# Patient Record
Sex: Male | Born: 1965 | Race: White | Hispanic: No | Marital: Single | Smoking: Former smoker
Health system: Southern US, Community
[De-identification: ages and names within clinical notes are randomized; demographics above are authoritative.]

## PROBLEM LIST (undated history)

## (undated) DIAGNOSIS — I10 Essential (primary) hypertension: Secondary | ICD-10-CM

## (undated) HISTORY — PX: ANKLE SURGERY: SHX546

---

## 2007-10-26 ENCOUNTER — Inpatient Hospital Stay (HOSPITAL_COMMUNITY): Admission: EM | Admit: 2007-10-26 | Discharge: 2007-10-27 | Payer: Self-pay | Admitting: Emergency Medicine

## 2008-12-04 IMAGING — CR DG CHEST 2V
2 series · 2 of 2 positions shown · non-contrast
Comparison: No priors

CLINICAL DATA: Chest pain

CHEST - 2 VIEW

[view not recorded (1 of 2)]
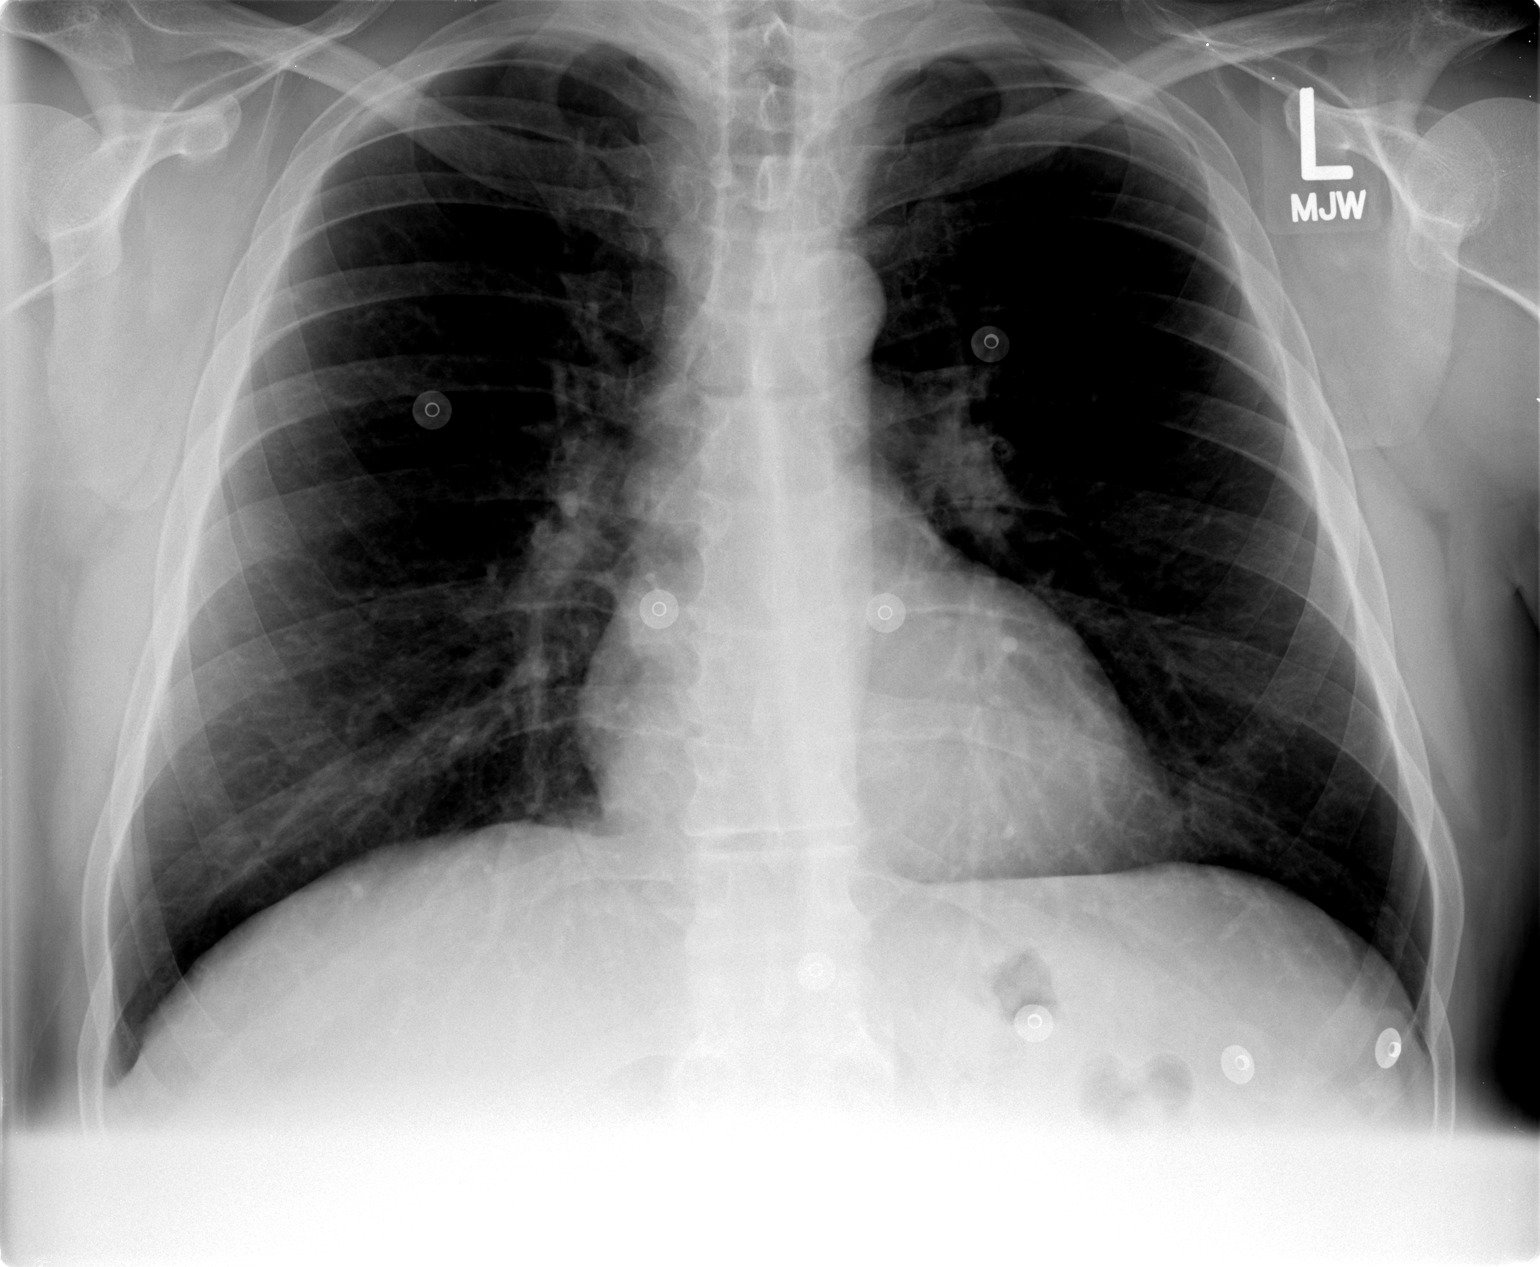

[view not recorded (2 of 2)]
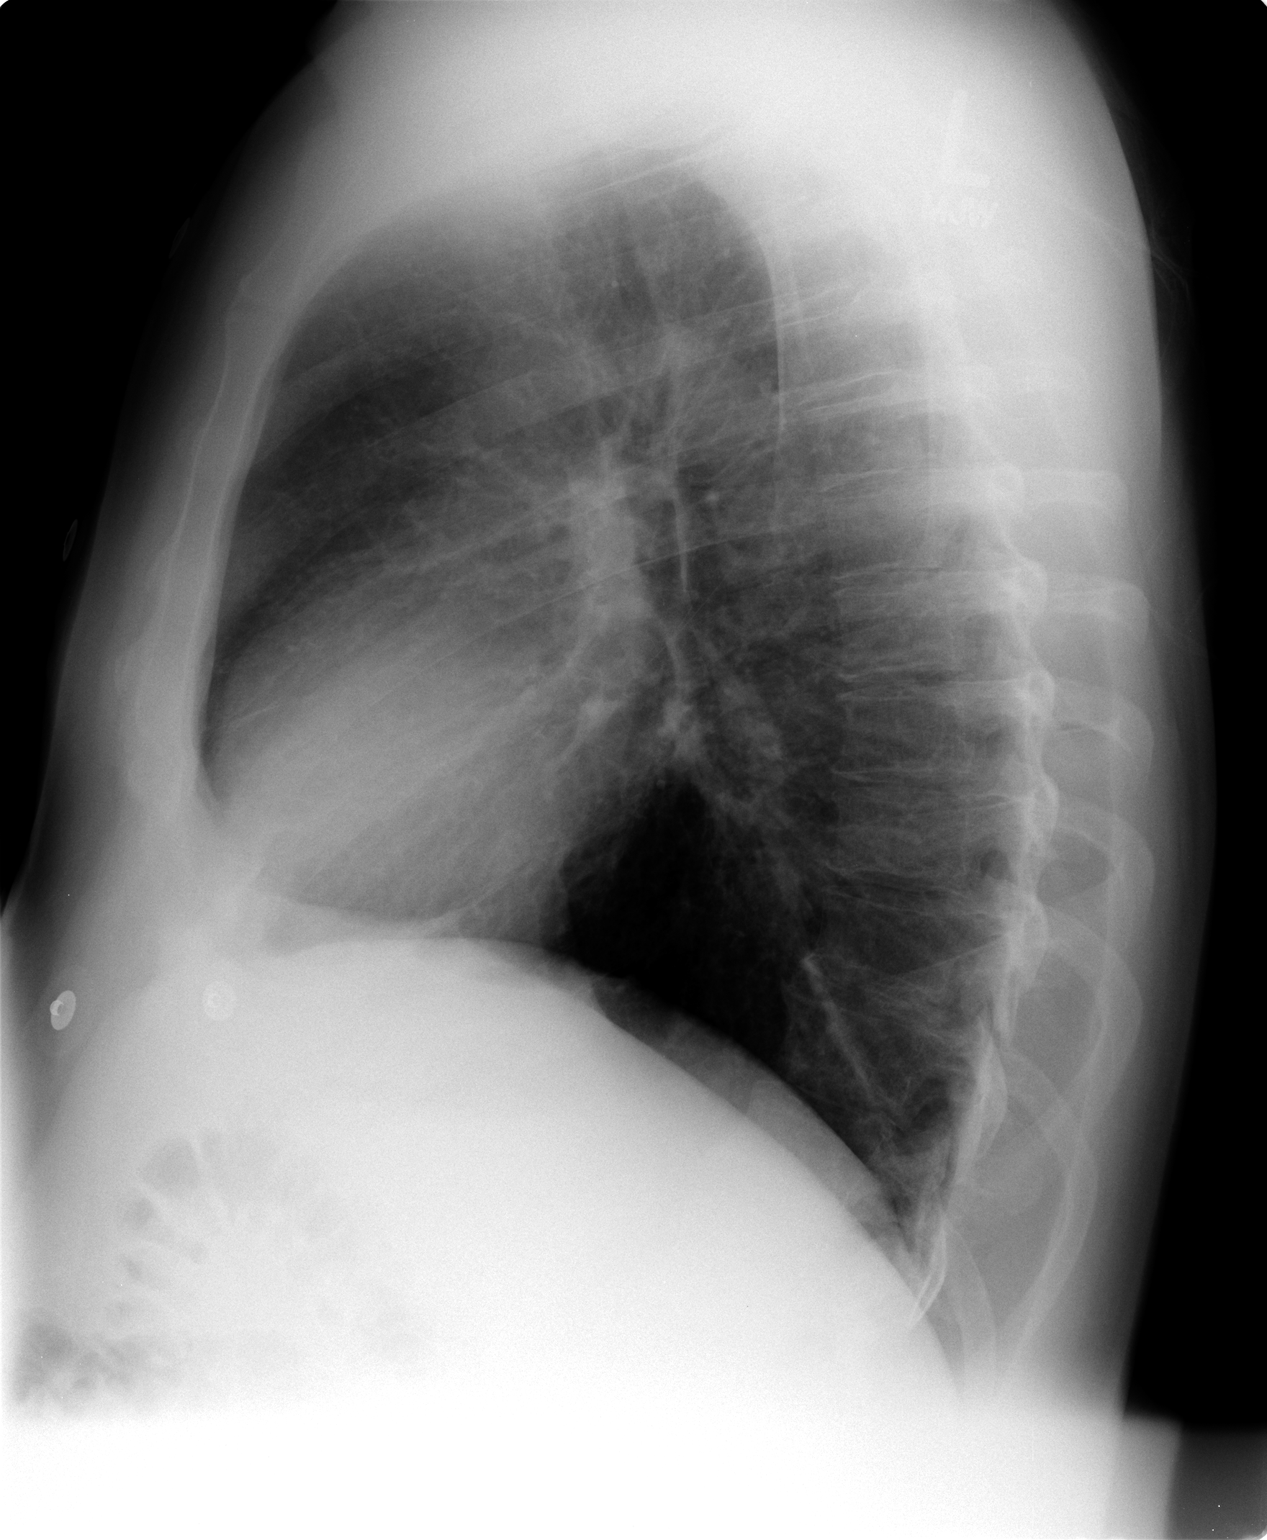

[2 of 2 positions shown; findings below may reference images not displayed]

FINDINGS: Heart and mediastinal contours normal.  Lungs clear.
Osseous structures intact.  No abnormality of the soft tissues.
IMPRESSION: No active disease.

## 2010-10-15 NOTE — Discharge Summary (Signed)
Aaron Lewis, Aaron Lewis             ACCOUNT NO.:  [id number]   MEDICAL RECORD NO.:  [id number]          PATIENT TYPE:  INP   LOCATION:  3731                         FACILITY:  [facility]   PHYSICIAN:  Beckey Rutter, MD  DATE OF BIRTH:  Jun 23, 1965   DATE OF ADMISSION:  10/26/2007  DATE OF DISCHARGE:  10/27/2007                               DISCHARGE SUMMARY   PRIMARY CARE PHYSICIAN:  Unassigned to [facility].   CHIEF COMPLAINT:  Chest pain.  Please refer to the H and P dictated  yesterday, day of admission for detailed account on the presenting  complaint.   HOSPITAL COURSE:  During hospitalization, the patient was ruled out for  acute coronary syndromes by serial EKGs and by cardiac enzymes.  The  patient is pain free today.   The patient was seen in consultation by [facility] with the  recommendation to follow up as an outpatient for possible stress test.  The patient is stable today to be released to follow up with his primary  physician and to follow up with [facility] for possible stress test.  The patient is aware and agreeable to discharge plan.   The patient does not have primary physician.  Currently, he was advised  to see a primary physician within 1 week.  The patient understand the  importance of follow up and he is agreeable to this plan.   Smoking.  The patient was counseled.   Drug abuse.  The patient counseled.   Hyperlipidemia.  The patient's HDL is low, although the LDL is  acceptable.  The patient was advised to make lifestyle modification and  to follow up with the primary.   Hospital consultation.  [facility].   DISCHARGE DIAGNOSES:  1. Chest pain, likely musculoskeletal.  2. Tobacco abuse.  3. Drug abuse.   DISCHARGE MEDICATION:  Aspirin 81 mg p.o. daily.   DISCHARGE DATA:  BNP is 77.0.  Cardiac panel was showing troponin 0.03  and CK-MB is 1.8, this is the third one.  Total cholesterol is 131, LDL  is 78, HDL is 22,  D-dimer is 0.22, and TSH is 1.9.  Drug screen in the  urine is  positive for benzodiazepines and tetrahydrocannabinol.  White blood  count on admission is 7.8, platelet count is 219, hemoglobin is 15.7,  and hematocrit is 45.5.   The patient is stable for discharge.      Beckey Rutter, MD  Electronically Signed     EME/MEDQ  D:  10/27/2007  T:  10/27/2007  Job:  [id number]

## 2010-10-15 NOTE — H&P (Signed)
NAMECHADWICK, Aaron Lewis             ACCOUNT NO.:  [id number]   MEDICAL RECORD NO.:  [id number]          PATIENT TYPE:  INP   LOCATION:  1826                         FACILITY:  [facility]   PHYSICIAN:  Eduard Clos, MDDATE OF BIRTH:  1965/09/27   DATE OF ADMISSION:  10/26/2007  DATE OF DISCHARGE:                              HISTORY & PHYSICAL   CHIEF COMPLAINT:  Neck pain.   HISTORY OF PRESENT ILLNESS:  A 45 year old male with no significant past  medical history.  Presently he comes into the ER with neck pain which  was radiating to his left chest and all the way to his left leg.  The  patient stated that this started last evening.  The patient, in  addition, was found to have another episode in the morning when he was  working at the [employer] when he developed this episode.  Along with  pain,  which was radiating all the way from his neck down to his legs, the  patient also became diaphoretic and nauseated.  The patient denies any  headache, dizziness, abdominal pain, dysuria, discharge, diarrhea.  The  patient did have the symptoms on exertion.   PAST MEDICAL HISTORY:  Nothing significant.   MEDICATIONS PRIOR TO ADMISSION:  None.   SOCIAL HISTORY:  The patient lives [place] girlfriend in [place],  comes to [place] for work.  Smokes cigarettes and abuses marijuana  and benzodiazepines.  Otherwise, the patient quit smoking and does not  abuse drugs.   FAMILY HISTORY:  Nothing contributory.   REVIEW OF SYSTEMS:  As in History of Present Illness, nothing else seen  of significance.   PHYSICAL EXAMINATION:  GENERAL:  The patient was examined at bedside,  not in acute distress.  VITAL SIGNS:  Blood pressure 143/94, pulse 79 per minute, temperature  98.1, respirations 18 per minute.  O2 saturation 99%.  HEENT:  Anicteric.  No pallor.  CHEST:  Bilaterally clear to auscultation. No rhonchi, no crepitation.  HEART:  S1, S2 heard.  ABDOMEN:  Soft, nontender.  Bowel sounds heard.   No guarding, no  rigidity.  CENTRAL NERVOUS SYSTEM:  Alert and oriented to time, place, and person.  No focal deficits.  EXTREMITIES:  Peripheral pulses felt.  No edema.   LABORATORY DATA:  EKG:  Normal sinus rhythm with no acute ST-T changes.   Chest x-ray:  Nothing acute.   CBC: WBC 7.8, hemoglobin 16, hematocrit 47, platelets 209, neutrophils  61%.  CMP:  Sodium 140, potassium 3.9, chloride 105, glucose 91, BUN 9,  creatinine 1.  CK-MB 1 and less than 1.  Troponin less than 0.05 and  0.16.  Toxicology screen is negative for cocaine.  It is positive for  benzodiazepines and marijuana.  UA is negative.   ASSESSMENT:  1. Neck pain with chest pain with mildly elevated troponin.  2. Hypertension.  3. Drug abuse.  4. Cigarette smoking.   PLAN:  1. Admit patient to telemetry, Team D.  2. Follow cardiac markers.  3. Cardiology has been already consulted and will follow up with their      recommendations.  4. We advised patient not to smoke cigarettes or abuse drugs.  5. Further recommendations as patient's condition evolves.      Eduard Clos, MD  Electronically Signed     ANK/MEDQ  D:  10/26/2007  T:  10/26/2007  Job:  [id number]

## 2011-02-26 LAB — POCT CARDIAC MARKERS
Myoglobin, poc: 68.9
Operator id: 222501
Operator id: 234501
Troponin i, poc: 0.16 — ABNORMAL HIGH
Troponin i, poc: <0.05

## 2011-02-26 LAB — CBC
MCHC: 34.5
Platelets: 219
RDW: 13.2

## 2011-02-26 LAB — DIFFERENTIAL
Basophils Absolute: 0.1
Basophils Relative: 1
Monocytes Absolute: 0.7
Neutro Abs: 4.7
Neutrophils Relative %: 61

## 2011-02-26 LAB — LIPID PANEL: VLDL: 31

## 2011-02-26 LAB — URINALYSIS, ROUTINE W REFLEX MICROSCOPIC
Bilirubin Urine: NEGATIVE
Nitrite: NEGATIVE
Specific Gravity, Urine: 1.006
Urobilinogen, UA: 0.2

## 2011-02-26 LAB — CARDIAC PANEL(CRET KIN+CKTOT+MB+TROPI)
CK, MB: 1.8
Total CK: 136

## 2011-02-26 LAB — CK TOTAL AND CKMB (NOT AT ARMC)
Relative Index: 1.5
Total CK: 188

## 2011-02-26 LAB — RAPID URINE DRUG SCREEN, HOSP PERFORMED
Opiates: NOT DETECTED
Tetrahydrocannabinol: POSITIVE — AB

## 2011-02-26 LAB — POCT I-STAT, CHEM 8
BUN: 9
Calcium, Ion: 1.17
Chloride: 105
Sodium: 140

## 2011-02-26 LAB — B-NATRIURETIC PEPTIDE (CONVERTED LAB): Pro B Natriuretic peptide (BNP): 77

## 2011-02-26 LAB — TROPONIN I: Troponin I: 0.02

## 2017-02-12 NOTE — Congregational Nurse Program (Signed)
Congregational Nurse Program Note  Date of Encounter: 02/09/2017  Past Medical History: No past medical history on file.  Encounter Details:     CNP Questionnaire - 02/02/17 1100      Patient Demographics   Is this a new or existing patient? New   Patient is considered a/an Not Applicable   Race Caucasian/White     Patient Assistance   Location of Patient Assistance [place]   Patient's financial/insurance status Self-Pay (Uninsured)   Uninsured Patient ([place] [employer]Care Connects) No   Interventions Assisted patient in making appt.   Patient referred to apply for the following financial assistance [employer]   Food insecurities addressed Not Applicable   Transportation assistance No   Assistance securing medications No   Educational health offerings Hypertension;Navigating the healthcare system     Encounter Details   Primary purpose of visit Navigating the Healthcare System   Was an Emergency Department visit averted? No   Does patient have a medical provider? No   Patient referred to [facility];Establish PCP   Was a mental health screening completed? (GAINS tool) No   Does patient have dental issues? No   Does patient have vision issues? No   Does your patient have an abnormal blood pressure today? Yes   Since previous encounter, have you referred patient for abnormal blood pressure that resulted in a new diagnosis or medication change? No   Does your patient have an abnormal blood glucose today? No   Since previous encounter, have you referred patient for abnormal blood glucose that resulted in a new diagnosis or medication change? No   Was there a life-saving intervention made? No     [facility] to [facility] a primarycare provider.  Client has medical conditions: steel plate in foot, complaints of pain in riight wrist. Client has elevated BP today. 167/119 P-87. Client is not on medications.  Client has applied for disability but was  denied.  Client was given information about Teacher, adult [facility]CareConnect [facility] [employer] [facility].to apply for MAP. Telephone call to schedule appointment. Client will be notified of appt.  Pearletha AlfredJan Nkosi Cortright,RN   [phone].

## 2017-02-23 ENCOUNTER — Other Ambulatory Visit (HOSPITAL_COMMUNITY)
Admission: RE | Admit: 2017-02-23 | Discharge: 2017-02-23 | Disposition: A | Discharge status: Home / Self Care | Payer: Self-pay | Source: Ambulatory Visit | Attending: Physician Assistant | Admitting: Physician Assistant

## 2017-02-23 ENCOUNTER — Ambulatory Visit: Payer: Self-pay | Admitting: Physician Assistant

## 2017-02-23 ENCOUNTER — Encounter: Payer: Self-pay | Admitting: Physician Assistant

## 2017-02-23 VITALS — BP 144/100 | HR 82 | Temp 97.7°F | Ht 69.0 in | Wt 268.5 lb

## 2017-02-23 DIAGNOSIS — Z125 Encounter for screening for malignant neoplasm of prostate: Secondary | ICD-10-CM | POA: Insufficient documentation

## 2017-02-23 DIAGNOSIS — R030 Elevated blood-pressure reading, without diagnosis of hypertension: Secondary | ICD-10-CM

## 2017-02-23 DIAGNOSIS — Z1211 Encounter for screening for malignant neoplasm of colon: Secondary | ICD-10-CM

## 2017-02-23 DIAGNOSIS — E669 Obesity, unspecified: Secondary | ICD-10-CM | POA: Insufficient documentation

## 2017-02-23 DIAGNOSIS — Z131 Encounter for screening for diabetes mellitus: Secondary | ICD-10-CM

## 2017-02-23 DIAGNOSIS — Z13220 Encounter for screening for lipoid disorders: Secondary | ICD-10-CM

## 2017-02-23 LAB — PSA: Prostatic Specific Antigen: 0.34 ng/mL (ref 0.00–4.00)

## 2017-02-23 LAB — COMPREHENSIVE METABOLIC PANEL WITH GFR
ALT: 38 U/L (ref 17–63)
AST: 32 U/L (ref 15–41)
Albumin: 4.1 g/dL (ref 3.5–5.0)
Alkaline Phosphatase: 83 U/L (ref 38–126)
Anion gap: 8 (ref 5–15)
BUN: 7 mg/dL (ref 6–20)
CO2: 24 mmol/L (ref 22–32)
Calcium: 8.9 mg/dL (ref 8.9–10.3)
Chloride: 104 mmol/L (ref 101–111)
Creatinine, Ser: 0.74 mg/dL (ref 0.61–1.24)
GFR calc Af Amer: >60 mL/min
GFR calc non Af Amer: >60 mL/min
Glucose, Bld: 157 mg/dL — ABNORMAL HIGH (ref 65–99)
Potassium: 3.8 mmol/L (ref 3.5–5.1)
Sodium: 136 mmol/L (ref 135–145)
Total Bilirubin: 0.6 mg/dL (ref 0.3–1.2)
Total Protein: 7.6 g/dL (ref 6.5–8.1)

## 2017-02-23 LAB — HEMOGLOBIN A1C
Hgb A1c MFr Bld: 5.9 % — ABNORMAL HIGH (ref 4.8–5.6)
Mean Plasma Glucose: 122.63 mg/dL

## 2017-02-23 LAB — LIPID PANEL
Cholesterol: 146 mg/dL (ref 0–200)
HDL: 28 mg/dL — ABNORMAL LOW
LDL Cholesterol: 98 mg/dL (ref 0–99)
Total CHOL/HDL Ratio: 5.2 ratio
Triglycerides: 98 mg/dL
VLDL: 20 mg/dL (ref 0–40)

## 2017-02-23 NOTE — Progress Notes (Signed)
BP (!) 144/100 (BP Location: Left Arm, Patient Position: Sitting, Cuff Size: Large)   Pulse 82   Temp 97.7 F (36.5 C) (Other (Comment))   Ht  (1.753 m)   Wt 268 lb 8 oz (121.8 kg)   SpO2 98%   BMI 39.65 kg/m    Subjective:    Patient ID: Aaron Lewis, male    DOB: 1966-01-28, 51 y.o.   MRN: [id number]  HPI: Darrol Brandenburg is a 51 y.o. male presenting on 02/23/2017 for New Patient (Initial Visit)   HPI  Pt says it's been many years since he had a PCP  C/o pain L wrist- x 2 year. Injured it 10year ago- jammed on handlebars but only started hurting 2 year ago.    Also c/o pain L ankle due to 10 bolts in it.  Hurts all the time  Pt doesn't work.  Relevant past medical, surgical, family and social history reviewed and updated as indicated. Interim medical history since our last visit reviewed. Allergies and medications reviewed and updated.  No current outpatient prescriptions on file.   Review of Systems  Constitutional: Positive for diaphoresis and fatigue. Negative for appetite change, chills, fever and unexpected weight change.  HENT: Positive for dental problem and ear pain. Negative for congestion, drooling, facial swelling, hearing loss, mouth sores, sneezing, sore throat, trouble swallowing and voice change.   Eyes: Negative for pain, discharge, redness, itching and visual disturbance.  Respiratory: Negative for cough, choking, shortness of breath and wheezing.   Cardiovascular: Positive for leg swelling. Negative for chest pain and palpitations.  Gastrointestinal: Positive for constipation. Negative for abdominal pain, blood in stool, diarrhea and vomiting.  Endocrine: Negative for cold intolerance, heat intolerance and polydipsia.  Genitourinary: Negative for decreased urine volume, dysuria and hematuria.  Musculoskeletal: Positive for arthralgias, back pain and gait problem.  Skin: Negative for rash.  Allergic/Immunologic: Negative for environmental  allergies.  Neurological: Negative for seizures, syncope, light-headedness and headaches.  Hematological: Negative for adenopathy.  Psychiatric/Behavioral: Positive for dysphoric mood. Negative for agitation and suicidal ideas. The patient is nervous/anxious.     Per HPI unless specifically indicated above     Objective:    BP (!) 144/100 (BP Location: Left Arm, Patient Position: Sitting, Cuff Size: Large)   Pulse 82   Temp 97.7 F (36.5 C) (Other (Comment))   Ht  (1.753 m)   Wt 268 lb 8 oz (121.8 kg)   SpO2 98%   BMI 39.65 kg/m   Wt Readings from Last 3 Encounters:  02/23/17 268 lb 8 oz (121.8 kg)    Physical Exam  Constitutional: He is oriented to person, place, and time. He appears well-developed and well-nourished.  HENT:  Head: Normocephalic and atraumatic.  Mouth/Throat: Oropharynx is clear and moist. No oropharyngeal exudate.  Eyes: Pupils are equal, round, and reactive to light. Conjunctivae and EOM are normal.  Neck: Neck supple. No thyromegaly present.  Cardiovascular: Normal rate and regular rhythm.   Pulmonary/Chest: Effort normal and breath sounds normal. He has no wheezes. He has no rales.  Abdominal: Soft. Bowel sounds are normal. He exhibits no mass. There is no hepatosplenomegaly. There is no tenderness.  Musculoskeletal: He exhibits no edema.       Left wrist: Normal.  Lymphadenopathy:    He has no cervical adenopathy.  Neurological: He is alert and oriented to person, place, and time.  Skin: Skin is warm and dry. No rash noted.  Psychiatric: He has a normal mood  and affect. His behavior is normal. Thought content normal.  Vitals reviewed.        Assessment & Plan:   Encounter Diagnoses  Name Primary?  . Elevated blood-pressure reading without diagnosis of hypertension Yes  . Obesity, unspecified classification, unspecified obesity type, unspecified whether serious comorbidity present   . Screening cholesterol level   . Screening for  diabetes mellitus   . Screening for prostate cancer   . Screen for colon cancer     -will get baseline labs -pt is given ifobt for colon cancer screening -will Recheck bp 1 month before starting medication -pt to RTO sooner prn

## 2017-02-26 ENCOUNTER — Other Ambulatory Visit: Payer: Self-pay | Admitting: Physician Assistant

## 2017-02-26 DIAGNOSIS — Z1211 Encounter for screening for malignant neoplasm of colon: Secondary | ICD-10-CM

## 2017-02-26 LAB — IFOBT (OCCULT BLOOD): IFOBT: NEGATIVE

## 2017-03-02 ENCOUNTER — Ambulatory Visit: Payer: Self-pay | Admitting: Physician Assistant

## 2017-03-17 ENCOUNTER — Encounter: Payer: Self-pay | Admitting: Student

## 2017-03-25 ENCOUNTER — Ambulatory Visit: Payer: Self-pay | Admitting: Physician Assistant

## 2017-03-25 ENCOUNTER — Encounter: Payer: Self-pay | Admitting: Physician Assistant

## 2017-03-25 VITALS — BP 158/100 | HR 77 | Temp 97.7°F | Ht 69.0 in | Wt 270.2 lb

## 2017-03-25 DIAGNOSIS — I10 Essential (primary) hypertension: Secondary | ICD-10-CM

## 2017-03-25 DIAGNOSIS — R7303 Prediabetes: Secondary | ICD-10-CM

## 2017-03-25 DIAGNOSIS — M25532 Pain in left wrist: Secondary | ICD-10-CM

## 2017-03-25 MED ORDER — LISINOPRIL 10 MG PO TABS: 10.0000 mg | ORAL_TABLET | Freq: Every day | ORAL | 1 refills | Status: DC

## 2017-03-25 NOTE — Patient Instructions (Signed)
Prediabetes Prediabetes is the condition of having a blood sugar (blood glucose) level that is higher than it should be, but not high enough for you to be diagnosed with type 2 diabetes. Having prediabetes puts you at risk for developing type 2 diabetes (type 2 diabetes mellitus). Prediabetes may be called impaired glucose tolerance or impaired fasting glucose. Prediabetes usually does not cause symptoms. Your health care provider can diagnose this condition with blood tests. You may be tested for prediabetes if you are overweight and if you have at least one other risk factor for prediabetes. Risk factors for prediabetes include:  Having a family member with type 2 diabetes.  Being overweight or obese.  Being older than age 45.  Being of American-Indian, African-American, Hispanic/Latino, or Asian/Pacific Islander descent.  Having an inactive (sedentary) lifestyle.  Having a history of gestational diabetes or polycystic ovarian syndrome (PCOS).  Having low levels of good cholesterol (HDL-C) or high levels of blood fats (triglycerides).  Having high blood pressure.  What is blood glucose and how is blood glucose measured?  Blood glucose refers to the amount of glucose in your bloodstream. Glucose comes from eating foods that contain sugars and starches (carbohydrates) that the body breaks down into glucose. Your blood glucose level may be measured in mg/dL (milligrams per deciliter) or mmol/L (millimoles per liter).Your blood glucose may be checked with one or more of the following blood tests:  A fasting blood glucose (FBG) test. You will not be allowed to eat (you will fast) for at least 8 hours before a blood sample is taken. ? A normal range for FBG is 70-100 mg/dl (3.9-5.6 mmol/L).  An A1c (hemoglobin A1c) blood test. This test provides information about blood glucose control over the previous 2?3months.  An oral glucose tolerance test (OGTT). This test measures your blood  glucose twice: ? After fasting. This is your baseline level. ? Two hours after you drink a beverage that contains glucose.  You may be diagnosed with prediabetes:  If your FBG is 100?125 mg/dL (5.6-6.9 mmol/L).  If your A1c level is 5.7?6.4%.  If your OGGT result is 140?199 mg/dL (7.8-11 mmol/L).  These blood tests may be repeated to confirm your diagnosis. What happens if blood glucose is too high? The pancreas produces a hormone (insulin) that helps move glucose from the bloodstream into cells. When cells in the body do not respond properly to insulin that the body makes (insulin resistance), excess glucose builds up in the blood instead of going into cells. As a result, high blood glucose (hyperglycemia) can develop, which can cause many complications. This is a symptom of prediabetes. What can happen if blood glucose stays higher than normal for a long time? Having high blood glucose for a long time is dangerous. Too much glucose in your blood can damage your nerves and blood vessels. Long-term damage can lead to complications from diabetes, which may include:  Heart disease.  Stroke.  Blindness.  Kidney disease.  Depression.  Poor circulation in the feet and legs, which could lead to surgical removal (amputation) in severe cases.  How can prediabetes be prevented from turning into type 2 diabetes?  To help prevent type 2 diabetes, take the following actions:  Be physically active. ? Do moderate-intensity physical activity for at least 30 minutes on at least 5 days of the week, or as much as told by your health care provider. This could be brisk walking, biking, or water aerobics. ? Ask your health care provider what   activities are safe for you. A mix of physical activities may be best, such as walking, swimming, cycling, and strength training.  Lose weight as told by your health care provider. ? Losing 5-7% of your body weight can reverse insulin resistance. ? Your health  care provider can determine how much weight loss is best for you and can help you lose weight safely.  Follow a healthy meal plan. This includes eating lean proteins, complex carbohydrates, fresh fruits and vegetables, low-fat dairy products, and healthy fats. ? Follow instructions from your health care provider about eating or drinking restrictions. ? Make an appointment to see a diet and nutrition specialist (registered dietitian) to help you create a healthy eating plan that is right for you.  Do not smoke or use any tobacco products, such as cigarettes, chewing tobacco, and e-cigarettes. If you need help quitting, ask your health care provider.  Take over-the-counter and prescription medicines as told by your health care provider. You may be prescribed medicines that help lower the risk of type 2 diabetes.  This information is not intended to replace advice given to you by your health care provider. Make sure you discuss any questions you have with your health care provider. Document Released: 09/10/2015 Document Revised: 10/25/2015 Document Reviewed: 07/10/2015 Elsevier Interactive Patient Education  2018 [employer]vier Inc.  

## 2017-03-25 NOTE — Progress Notes (Signed)
BP (!) 158/100 (BP Location: Left Arm, Patient Position: Sitting, Cuff Size: Normal)   Pulse 77   Temp 97.7 F (36.5 C)   Ht 5\' 9"  (1.753 m)   Wt 270 lb 4 oz (122.6 kg)   SpO2 98%   BMI 39.91 kg/m    Subjective:    Patient ID: Aaron Lewis, male    DOB: 1965/06/22, 51 y.o.   MRN: [id number]  HPI: Aaron Lewis is a 51 y.o. male presenting on 03/25/2017 for Follow-up   HPI    Pt hurting L wrist for years and he feels like he has multiple fractures in there.  It hurts with any kind of use.   No recent injury.   He says it pops frequently.   Relevant past medical, surgical, family and social history reviewed and updated as indicated. Interim medical history since our last visit reviewed. Allergies and medications reviewed and updated.  No current outpatient prescriptions on file.   Review of Systems  Constitutional: Negative for appetite change, chills, diaphoresis, fatigue, fever and unexpected weight change.  HENT: Negative for congestion, dental problem, drooling, ear pain, facial swelling, hearing loss, mouth sores, sneezing, sore throat, trouble swallowing and voice change.   Eyes: Negative for pain, discharge, redness, itching and visual disturbance.  Respiratory: Negative for cough, choking, shortness of breath and wheezing.   Cardiovascular: Positive for leg swelling. Negative for chest pain and palpitations.  Gastrointestinal: Positive for constipation. Negative for abdominal pain, blood in stool, diarrhea and vomiting.  Endocrine: Negative for cold intolerance, heat intolerance and polydipsia.  Genitourinary: Negative for decreased urine volume, dysuria and hematuria.  Musculoskeletal: Positive for arthralgias, back pain and gait problem.  Skin: Negative for rash.  Allergic/Immunologic: Negative for environmental allergies.  Neurological: Negative for seizures, syncope, light-headedness and headaches.  Hematological: Negative for adenopathy.   Psychiatric/Behavioral: Negative for agitation, dysphoric mood and suicidal ideas. The patient is not nervous/anxious.     Per HPI unless specifically indicated above     Objective:    BP (!) 158/100 (BP Location: Left Arm, Patient Position: Sitting, Cuff Size: Normal)   Pulse 77   Temp 97.7 F (36.5 C)   Ht 5\' 9"  (1.753 m)   Wt 270 lb 4 oz (122.6 kg)   SpO2 98%   BMI 39.91 kg/m   Wt Readings from Last 3 Encounters:  03/25/17 270 lb 4 oz (122.6 kg)  02/23/17 268 lb 8 oz (121.8 kg)    Physical Exam  Constitutional: He is oriented to person, place, and time. He appears well-developed and well-nourished.  HENT:  Head: Normocephalic and atraumatic.  Neck: Neck supple.  Cardiovascular: Normal rate and regular rhythm.   Pulses:      Radial pulses are 2+ on the left side.  Pulmonary/Chest: Effort normal and breath sounds normal. He has no wheezes.  Abdominal: Soft. Bowel sounds are normal. There is no hepatosplenomegaly. There is no tenderness.  Musculoskeletal: He exhibits no edema.       Left wrist: Normal. He exhibits normal range of motion, no tenderness, no bony tenderness, no swelling, no effusion, no crepitus and no deformity.  Lymphadenopathy:    He has no cervical adenopathy.  Neurological: He is alert and oriented to person, place, and time.  Skin: Skin is warm and dry.  Psychiatric: He has a normal mood and affect. His behavior is normal.  Vitals reviewed.   Results for orders placed or performed in visit on 02/26/17  IFOBT POC (occult bld, rslt  in office)  Result Value Ref Range   IFOBT Negative       Assessment & Plan:   Encounter Diagnoses  Name Primary?  . Essential hypertension Yes  . Prediabetes   . Left wrist pain     -reviewed labs with pt -rx lisinopril for htn -counseled on prediabetes and gave handout.  Counseled on diet and exercise for weight management -recommended ice to L wrist 3-4 times daily for 10-20 minutes.  Can use IBU or aleve as  well -pt to follow up 1 month to recheck bp

## 2017-04-29 ENCOUNTER — Encounter: Payer: Self-pay | Admitting: Physician Assistant

## 2017-04-29 ENCOUNTER — Ambulatory Visit: Payer: Self-pay | Admitting: Physician Assistant

## 2017-04-29 VITALS — BP 136/88 | HR 79 | Ht 69.0 in | Wt 270.0 lb

## 2017-04-29 DIAGNOSIS — I10 Essential (primary) hypertension: Secondary | ICD-10-CM

## 2017-04-29 NOTE — Progress Notes (Signed)
BP 136/88 (BP Location: Left Arm, Patient Position: Sitting, Cuff Size: Large)   Pulse 79   Ht 5\' 9"IciDrIOYVlt$  (1.753 m)   Wt 270 lb (122.5 kg)   SpO2 98%   BMI 39.87 kg/m    Subjective:    Patient ID: Aaron Lewis, male    DOB: 09/04/1965, 51 y.o.   MRN: [id number]  HPI: Aaron Lewis is a 51 y.o. male presenting on 04/29/2017 for Hypertension   HPI   Pt says he gets cold about 3 hours after he takes his meds and in the afternoons 3 or 4 days/week and he sometimes gets abdominal pain.  He thinks these are related to his lisinopril.    Relevant past medical, surgical, family and social history reviewed and updated as indicated. Interim medical history since our last visit reviewed. Allergies and medications reviewed and updated.   Current Outpatient Medications:  .  lisinopril (PRINIVIL,ZESTRIL) 10 MG tablet, Take 1 tablet (10 mg total) by mouth daily., Disp: 30 tablet, Rfl: 1  Review of Systems  Constitutional: Positive for chills, diaphoresis and fatigue. Negative for appetite change, fever and unexpected weight change.  HENT: Positive for dental problem. Negative for congestion, drooling, ear pain, facial swelling, hearing loss, mouth sores, sneezing, sore throat, trouble swallowing and voice change.   Eyes: Negative for pain, discharge, redness, itching and visual disturbance.  Respiratory: Positive for cough. Negative for choking, shortness of breath and wheezing.   Cardiovascular: Negative for chest pain, palpitations and leg swelling.  Gastrointestinal: Positive for abdominal pain and constipation. Negative for blood in stool, diarrhea and vomiting.  Endocrine: Negative for cold intolerance, heat intolerance and polydipsia.  Genitourinary: Negative for decreased urine volume, dysuria and hematuria.  Musculoskeletal: Positive for arthralgias, back pain and gait problem.  Skin: Negative for rash.  Allergic/Immunologic: Negative for environmental allergies.  Neurological:  Negative for seizures, syncope, light-headedness and headaches.  Hematological: Negative for adenopathy.  Psychiatric/Behavioral: Negative for agitation, dysphoric mood and suicidal ideas. The patient is not nervous/anxious.     Per HPI unless specifically indicated above     Objective:    BP 136/88 (BP Location: Left Arm, Patient Position: Sitting, Cuff Size: Large)   Pulse 79   Ht 5\' 9"  (1.753 m)   Wt 270 lb (122.5 kg)   SpO2 98%   BMI 39.87 kg/m   Wt Readings from Last 3 Encounters:  04/29/17 270 lb (122.5 kg)  03/25/17 270 lb 4 oz (122.6 kg)  02/23/17 268 lb 8 oz (121.8 kg)    Physical Exam  Constitutional: He is oriented to person, place, and time. He appears well-developed and well-nourished.  HENT:  Head: Normocephalic and atraumatic.  Neck: Neck supple.  Cardiovascular: Normal rate and regular rhythm.  Pulmonary/Chest: Effort normal and breath sounds normal. He has no wheezes.  Abdominal: Soft. Bowel sounds are normal. There is no hepatosplenomegaly. There is no tenderness.  Musculoskeletal: He exhibits no edema.  Lymphadenopathy:    He has no cervical adenopathy.  Neurological: He is alert and oriented to person, place, and time.  Skin: Skin is warm and dry.  Psychiatric: He has a normal mood and affect. His behavior is normal.  Vitals reviewed.       Assessment & Plan:   Encounter Diagnosis  Name Primary?  . Essential hypertension Yes     -discussed with pt that lisinopril is unlikely to cause chills or abdominal pain.  His exam today is normal.  Discussed change in medication but pt would  like to continue with current or now and discuss at next appointment if he is still having problems.  -pt will continue current medications and follow up in 6 weeks to recheck.  He is to RTO sooner if his symptoms worsen or if he develops new symptoms

## 2017-06-04 ENCOUNTER — Other Ambulatory Visit: Payer: Self-pay | Admitting: Physician Assistant

## 2017-06-10 ENCOUNTER — Encounter: Payer: Self-pay | Admitting: Physician Assistant

## 2017-06-10 ENCOUNTER — Other Ambulatory Visit (HOSPITAL_COMMUNITY)
Admission: RE | Admit: 2017-06-10 | Discharge: 2017-06-10 | Disposition: A | Discharge status: Home / Self Care | Payer: Self-pay | Source: Ambulatory Visit | Attending: Physician Assistant | Admitting: Physician Assistant

## 2017-06-10 ENCOUNTER — Ambulatory Visit: Payer: Self-pay | Admitting: Physician Assistant

## 2017-06-10 VITALS — BP 110/72 | HR 80 | Temp 97.5°F | Ht 69.0 in | Wt 268.0 lb

## 2017-06-10 DIAGNOSIS — I10 Essential (primary) hypertension: Secondary | ICD-10-CM

## 2017-06-10 DIAGNOSIS — L255 Unspecified contact dermatitis due to plants, except food: Secondary | ICD-10-CM

## 2017-06-10 DIAGNOSIS — E669 Obesity, unspecified: Secondary | ICD-10-CM

## 2017-06-10 LAB — BASIC METABOLIC PANEL
ANION GAP: 8 (ref 5–15)
BUN: 11 mg/dL (ref 6–20)
CALCIUM: 8.8 mg/dL — AB (ref 8.9–10.3)
CO2: 25 mmol/L (ref 22–32)
CREATININE: 0.73 mg/dL (ref 0.61–1.24)
Chloride: 104 mmol/L (ref 101–111)
GFR calc Af Amer: >60 mL/min (ref >60)
GFR calc non Af Amer: >60 mL/min (ref >60)
GLUCOSE: 136 mg/dL — AB (ref 65–99)
Potassium: 4 mmol/L (ref 3.5–5.1)
Sodium: 137 mmol/L (ref 135–145)

## 2017-06-10 NOTE — Patient Instructions (Signed)
Poison Ivy Dermatitis Poison ivy dermatitis is redness and soreness (inflammation) of the skin. It is caused by a chemical that is found on the leaves of the poison ivy plant. You may also have itching, a rash, and blisters. Symptoms often clear up in 1-2 weeks. You may get this condition by touching a poison ivy plant. You can also get it by touching something that has the chemical on it. This may include animals or objects that have come in contact with the plant. Follow these instructions at home: General instructions  Take or apply over-the-counter and prescription medicines only as told by your doctor.  If you touch poison ivy, wash your skin with soap and cold water right away.  Use hydrocortisone creams or calamine lotion as needed to help with itching.  Take oatmeal baths as needed. Use colloidal oatmeal. You can get this at a pharmacy or grocery store. Follow the instructions on the package.  Do not scratch or rub your skin.  While you have the rash, wash your clothes right after you wear them. Prevention  Know what poison ivy looks like so you can avoid it. This plant has three leaves with flowering branches on a single stem. The leaves are glossy. They have uneven edges that come to a point at the front.  If you have touched poison ivy, wash with soap and water right away. Be sure to wash under your fingernails.  When hiking or camping, wear long pants, a long-sleeved shirt, tall socks, and hiking boots. You can also use a lotion on your skin that helps to prevent contact with the chemical on the plant.  If you think that your clothes or outdoor gear came in contact with poison ivy, rinse them off with a garden hose before you bring them inside your house. Contact a doctor if:  You have open sores in the rash area.  You have more redness, swelling, or pain in the affected area.  You have redness that spreads beyond the rash area.  You have fluid, blood, or pus coming from  the affected area.  You have a fever.  You have a rash over a large area of your body.  You have a rash on your eyes, mouth, or genitals.  Your rash does not get better after a few days. Get help right away if:  Your face swells or your eyes swell shut.  You have trouble breathing.  You have trouble swallowing. This information is not intended to replace advice given to you by your health care provider. Make sure you discuss any questions you have with your health care provider. Document Released: 06/21/2010 Document Revised: 10/25/2015 Document Reviewed: 10/25/2014 Elsevier Interactive Patient Education  2018 [employer]  

## 2017-06-10 NOTE — Progress Notes (Signed)
BP 110/72 (BP Location: Left Arm, Patient Position: Sitting, Cuff Size: Normal)   Pulse 80   Temp (!) 97.5 F (36.4 C)   Ht 5\' 9"  (1.753 m)   Wt 268 lb (121.6 kg)   SpO2 98%   BMI 39.58 kg/m    Subjective:    Patient ID: Aaron Lewis, male    DOB: February 08, 1966, 52 y.o.   MRN: [id number]  HPI: Aaron Lewis is a 52 y.o. male presenting on 06/10/2017 for Hypertension   HPI   Rash- for about 3 days on L ankle. pt states itchy.  No self treatment  Otherwise pt is doing well  Relevant past medical, surgical, family and social history reviewed and updated as indicated. Interim medical history since our last visit reviewed. Allergies and medications reviewed and updated.   Current Outpatient Medications:  .  lisinopril (PRINIVIL,ZESTRIL) 10 MG tablet, TAKE 1 TABLET BY MOUTH ONCE DAILY, Disp: 30 tablet, Rfl: 1  Review of Systems  Constitutional: Positive for diaphoresis and fatigue. Negative for appetite change, chills, fever and unexpected weight change.  HENT: Positive for dental problem. Negative for congestion, drooling, ear pain, facial swelling, hearing loss, mouth sores, sneezing, sore throat, trouble swallowing and voice change.   Eyes: Negative for pain, discharge, redness, itching and visual disturbance.  Respiratory: Positive for cough. Negative for choking, shortness of breath and wheezing.   Cardiovascular: Positive for leg swelling. Negative for chest pain and palpitations.  Gastrointestinal: Positive for constipation. Negative for abdominal pain, blood in stool, diarrhea and vomiting.  Endocrine: Negative for cold intolerance, heat intolerance and polydipsia.  Genitourinary: Negative for decreased urine volume, dysuria and hematuria.  Musculoskeletal: Positive for arthralgias, back pain and gait problem.  Skin: Positive for rash.  Allergic/Immunologic: Negative for environmental allergies.  Neurological: Negative for seizures, syncope, light-headedness and  headaches.  Hematological: Negative for adenopathy.  Psychiatric/Behavioral: Positive for dysphoric mood. Negative for agitation and suicidal ideas. The patient is not nervous/anxious.     Per HPI unless specifically indicated above     Objective:    BP 110/72 (BP Location: Left Arm, Patient Position: Sitting, Cuff Size: Normal)   Pulse 80   Temp (!) 97.5 F (36.4 C)   Ht 5\' 9"  (1.753 m)   Wt 268 lb (121.6 kg)   SpO2 98%   BMI 39.58 kg/m   Wt Readings from Last 3 Encounters:  06/10/17 268 lb (121.6 kg)  04/29/17 270 lb (122.5 kg)  03/25/17 270 lb 4 oz (122.6 kg)    Physical Exam  Constitutional: He is oriented to person, place, and time. He appears well-developed and well-nourished.  HENT:  Head: Normocephalic and atraumatic.  Neck: Neck supple.  Cardiovascular: Normal rate and regular rhythm.  Pulmonary/Chest: Effort normal and breath sounds normal. He has no wheezes.  Abdominal: Soft. Bowel sounds are normal. There is no hepatosplenomegaly. There is no tenderness.  Musculoskeletal: He exhibits no edema.  Lymphadenopathy:    He has no cervical adenopathy.  Neurological: He is alert and oriented to person, place, and time.  Skin: Skin is warm and dry. Rash noted. Rash is vesicular.     Anterior L ankle with linear streaks of vesicles on red, inflamed skin.  No rash seen elsewhere  Psychiatric: He has a normal mood and affect. His behavior is normal.  Vitals reviewed.       Assessment & Plan:   Encounter Diagnoses  Name Primary?  . Essential hypertension Yes  . Rhus dermatitis   .  Obesity, unspecified classification, unspecified obesity type, unspecified whether serious comorbidity present      -Offered prednisone and TAC cream but pt declined, wanting to use calamine lotion -pt to Contnue lisinopril for BP -check BMP today -pt to follow up 4 months.  RTO sooner prn

## 2017-08-11 ENCOUNTER — Other Ambulatory Visit: Payer: Self-pay | Admitting: Physician Assistant

## 2017-10-07 ENCOUNTER — Ambulatory Visit: Payer: Self-pay | Admitting: Physician Assistant

## 2017-10-14 ENCOUNTER — Encounter: Payer: Self-pay | Admitting: Physician Assistant

## 2017-10-28 ENCOUNTER — Ambulatory Visit: Payer: Self-pay | Admitting: Physician Assistant

## 2017-10-28 ENCOUNTER — Encounter: Payer: Self-pay | Admitting: Physician Assistant

## 2017-10-28 VITALS — BP 123/84 | HR 77 | Temp 97.6°F | Ht 69.0 in | Wt 271.5 lb

## 2017-10-28 DIAGNOSIS — K5900 Constipation, unspecified: Secondary | ICD-10-CM

## 2017-10-28 DIAGNOSIS — R7303 Prediabetes: Secondary | ICD-10-CM

## 2017-10-28 DIAGNOSIS — I10 Essential (primary) hypertension: Secondary | ICD-10-CM

## 2017-10-28 DIAGNOSIS — Z125 Encounter for screening for malignant neoplasm of prostate: Secondary | ICD-10-CM

## 2017-10-28 NOTE — Patient Instructions (Signed)

## 2017-10-28 NOTE — Progress Notes (Signed)
BP 123/84 (BP Location: Right Arm, Patient Position: Sitting, Cuff Size: Large)   Pulse 77   Temp 97.6 F (36.4 C) (Oral)   Ht  (1.753 m)   Wt 271 lb 8 oz (123.2 kg)   SpO2 97%   BMI 40.09 kg/m    Subjective:    Patient ID: Aaron Lewis, male    DOB: 1966-01-17, 52 y.o.   MRN: [id number]  HPI: Aaron Lewis is a 52 y.o. male presenting on 10/28/2017 for Hypertension   HPI   Chief Complaint  Patient presents with  . Hypertension     c/o problems with constipation and sweating,   Relevant past medical, surgical, family and social history reviewed and updated as indicated. Interim medical history since our last visit reviewed. Allergies and medications reviewed and updated.   Current Outpatient Medications:  .  aspirin 325 MG tablet, Take 650 mg by mouth 2 (two) times daily as needed., Disp: , Rfl:  .  lisinopril (PRINIVIL,ZESTRIL) 10 MG tablet, TAKE 1 TABLET BY MOUTH ONCE DAILY, Disp: 30 tablet, Rfl: 4   Review of Systems  Constitutional: Positive for diaphoresis and fatigue. Negative for appetite change, chills, fever and unexpected weight change.  HENT: Negative for congestion, dental problem, drooling, ear pain, facial swelling, hearing loss, mouth sores, sneezing, sore throat, trouble swallowing and voice change.   Eyes: Negative for pain, discharge, redness, itching and visual disturbance.  Respiratory: Positive for cough. Negative for choking, shortness of breath and wheezing.   Cardiovascular: Negative for chest pain, palpitations and leg swelling.  Gastrointestinal: Negative for abdominal pain, blood in stool, constipation, diarrhea and vomiting.  Endocrine: Negative for cold intolerance, heat intolerance and polydipsia.  Genitourinary: Negative for decreased urine volume, dysuria and hematuria.  Musculoskeletal: Positive for arthralgias, back pain and gait problem.  Skin: Negative for rash.  Allergic/Immunologic: Negative for environmental allergies.   Neurological: Negative for seizures, syncope, light-headedness and headaches.  Hematological: Negative for adenopathy.  Psychiatric/Behavioral: Negative for agitation, dysphoric mood and suicidal ideas. The patient is not nervous/anxious.     Per HPI unless specifically indicated above     Objective:    BP 123/84 (BP Location: Right Arm, Patient Position: Sitting, Cuff Size: Large)   Pulse 77   Temp 97.6 F (36.4 C) (Oral)   Ht  (1.753 m)   Wt 271 lb 8 oz (123.2 kg)   SpO2 97%   BMI 40.09 kg/m   Wt Readings from Last 3 Encounters:  10/28/17 271 lb 8 oz (123.2 kg)  06/10/17 268 lb (121.6 kg)  04/29/17 270 lb (122.5 kg)    Physical Exam  Constitutional: He is oriented to person, place, and time. He appears well-developed and well-nourished.  HENT:  Head: Normocephalic and atraumatic.  Neck: Neck supple.  Cardiovascular: Normal rate and regular rhythm.  Pulmonary/Chest: Effort normal and breath sounds normal. He has no wheezes.  Abdominal: Soft. Bowel sounds are normal. There is no hepatosplenomegaly. There is no tenderness.  Musculoskeletal: He exhibits no edema.  Lymphadenopathy:    He has no cervical adenopathy.  Neurological: He is alert and oriented to person, place, and time.  Skin: Skin is warm and dry.  Psychiatric: He has a normal mood and affect. His behavior is normal.  Vitals reviewed.       Assessment & Plan:   Encounter Diagnoses  Name Primary?  . Essential hypertension Yes  . Prediabetes   . Morbid obesity (HCC)   . Screening for prostate cancer   .  Constipation, unspecified constipation type      -pt to continue current medications -counseled pt on constipation including to drink enough water and increase fiber.  Gave reading information -Follow up 4 months with labs before appointment

## 2018-02-02 ENCOUNTER — Other Ambulatory Visit: Payer: Self-pay | Admitting: Physician Assistant

## 2018-02-24 ENCOUNTER — Other Ambulatory Visit (HOSPITAL_COMMUNITY)
Admission: RE | Admit: 2018-02-24 | Discharge: 2018-02-24 | Disposition: A | Discharge status: Home / Self Care | Payer: Self-pay | Source: Ambulatory Visit | Attending: Physician Assistant | Admitting: Physician Assistant

## 2018-02-24 DIAGNOSIS — I10 Essential (primary) hypertension: Secondary | ICD-10-CM | POA: Insufficient documentation

## 2018-02-24 DIAGNOSIS — Z125 Encounter for screening for malignant neoplasm of prostate: Secondary | ICD-10-CM | POA: Insufficient documentation

## 2018-02-24 DIAGNOSIS — R7303 Prediabetes: Secondary | ICD-10-CM | POA: Insufficient documentation

## 2018-02-24 LAB — BASIC METABOLIC PANEL
Anion gap: 8 (ref 5–15)
BUN: 10 mg/dL (ref 6–20)
CALCIUM: 8.9 mg/dL (ref 8.9–10.3)
CO2: 27 mmol/L (ref 22–32)
CREATININE: 0.76 mg/dL (ref 0.61–1.24)
Chloride: 103 mmol/L (ref 98–111)
Glucose, Bld: 146 mg/dL — ABNORMAL HIGH (ref 70–99)
Potassium: 4.1 mmol/L (ref 3.5–5.1)
SODIUM: 138 mmol/L (ref 135–145)

## 2018-02-24 LAB — PSA: PROSTATIC SPECIFIC ANTIGEN: 0.47 ng/mL (ref 0.00–4.00)

## 2018-02-24 LAB — HEMOGLOBIN A1C
HEMOGLOBIN A1C: 5.8 % — AB (ref 4.8–5.6)
Mean Plasma Glucose: 119.76 mg/dL

## 2018-02-25 ENCOUNTER — Ambulatory Visit: Payer: Self-pay | Admitting: Physician Assistant

## 2018-02-25 ENCOUNTER — Encounter: Payer: Self-pay | Admitting: Physician Assistant

## 2018-02-25 VITALS — BP 140/90 | HR 72 | Temp 97.2°F | Ht 69.0 in | Wt 264.5 lb

## 2018-02-25 DIAGNOSIS — R7303 Prediabetes: Secondary | ICD-10-CM

## 2018-02-25 DIAGNOSIS — R062 Wheezing: Secondary | ICD-10-CM

## 2018-02-25 DIAGNOSIS — M545 Low back pain, unspecified: Secondary | ICD-10-CM

## 2018-02-25 DIAGNOSIS — J00 Acute nasopharyngitis [common cold]: Secondary | ICD-10-CM

## 2018-02-25 DIAGNOSIS — I10 Essential (primary) hypertension: Secondary | ICD-10-CM

## 2018-02-25 DIAGNOSIS — E669 Obesity, unspecified: Secondary | ICD-10-CM

## 2018-02-25 MED ORDER — PREDNISONE 10 MG PO TABS: ORAL_TABLET | ORAL | 0 refills | Status: DC
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
## 2018-02-25 NOTE — Patient Instructions (Signed)

## 2018-03-03 ENCOUNTER — Other Ambulatory Visit: Payer: Self-pay | Admitting: Physician Assistant

## 2018-06-22 ENCOUNTER — Other Ambulatory Visit (HOSPITAL_COMMUNITY)
Admission: RE | Admit: 2018-06-22 | Discharge: 2018-06-22 | Disposition: A | Discharge status: Home / Self Care | Payer: Self-pay | Source: Ambulatory Visit | Attending: Physician Assistant | Admitting: Physician Assistant

## 2018-06-22 ENCOUNTER — Ambulatory Visit: Payer: Self-pay | Admitting: Physician Assistant

## 2018-06-22 ENCOUNTER — Other Ambulatory Visit: Payer: Self-pay | Admitting: Physician Assistant

## 2018-06-22 ENCOUNTER — Encounter: Payer: Self-pay | Admitting: Physician Assistant

## 2018-06-22 VITALS — BP 137/76 | HR 78 | Temp 97.7°F | Ht 69.0 in | Wt 269.0 lb

## 2018-06-22 DIAGNOSIS — Z1211 Encounter for screening for malignant neoplasm of colon: Secondary | ICD-10-CM

## 2018-06-22 DIAGNOSIS — I10 Essential (primary) hypertension: Secondary | ICD-10-CM

## 2018-06-22 DIAGNOSIS — R61 Generalized hyperhidrosis: Secondary | ICD-10-CM | POA: Insufficient documentation

## 2018-06-22 LAB — CBC WITH DIFFERENTIAL/PLATELET
Abs Immature Granulocytes: 0.03 10*3/uL (ref 0.00–0.07)
BASOS ABS: 0.1 10*3/uL (ref 0.0–0.1)
Basophils Relative: 1 %
Eosinophils Absolute: 0.2 10*3/uL (ref 0.0–0.5)
Eosinophils Relative: 2 %
HEMATOCRIT: 44.7 % (ref 39.0–52.0)
HEMOGLOBIN: 14.9 g/dL (ref 13.0–17.0)
IMMATURE GRANULOCYTES: 0 %
LYMPHS ABS: 2.8 10*3/uL (ref 0.7–4.0)
Lymphocytes Relative: 28 %
MCH: 28.2 pg (ref 26.0–34.0)
MCHC: 33.3 g/dL (ref 30.0–36.0)
MCV: 84.5 fL (ref 80.0–100.0)
Monocytes Absolute: 1.1 10*3/uL — ABNORMAL HIGH (ref 0.1–1.0)
Monocytes Relative: 11 %
NEUTROS ABS: 5.9 10*3/uL (ref 1.7–7.7)
NRBC: 0 % (ref 0.0–0.2)
Neutrophils Relative %: 58 %
Platelets: 258 10*3/uL (ref 150–400)
RBC: 5.29 MIL/uL (ref 4.22–5.81)
RDW: 12.8 % (ref 11.5–15.5)
WBC: 10.1 10*3/uL (ref 4.0–10.5)

## 2018-06-22 LAB — BASIC METABOLIC PANEL
ANION GAP: 8 (ref 5–15)
BUN: 11 mg/dL (ref 6–20)
CO2: 25 mmol/L (ref 22–32)
Calcium: 9.1 mg/dL (ref 8.9–10.3)
Chloride: 104 mmol/L (ref 98–111)
Creatinine, Ser: 0.93 mg/dL (ref 0.61–1.24)
GFR calc non Af Amer: >60 mL/min (ref >60)
Glucose, Bld: 123 mg/dL — ABNORMAL HIGH (ref 70–99)
Potassium: 3.7 mmol/L (ref 3.5–5.1)
Sodium: 137 mmol/L (ref 135–145)

## 2018-06-22 LAB — TSH: TSH: 0.905 u[IU]/mL (ref 0.350–4.500)

## 2018-06-22 MED ORDER — LISINOPRIL 20 MG PO TABS: 20.0000 mg | ORAL_TABLET | Freq: Every day | ORAL | 4 refills | Status: DC

## 2018-06-22 NOTE — Progress Notes (Signed)
BP 137/76 (BP Location: Right Arm, Patient Position: Sitting, Cuff Size: Normal)   Pulse 78   Temp 97.7 F (36.5 C) (Oral)   Ht 5\' 9"  (1.753 m)   Wt 269 lb (122 kg)   SpO2 97%   BMI 39.72 kg/m    Subjective:    Patient ID: Aaron Lewis, male    DOB: 03/09/1966, 53 y.o.   MRN: [id number]  HPI: Aaron DapperBethel Current is a 53 y.o. male presenting on 06/22/2018 for Follow-up (hot and sweaty within and hour from waking up. pt states he feels cooler when he has his back against something where he feels "like there's ice on his back". began about 2 weeks ago) and Diarrhea (on and off for the past month)   HPI    Chief Complaint  Patient presents with  . Follow-up    hot and sweaty within and hour from waking up. pt states he feels cooler when he has his back against something where he feels "like there's ice on his back". began about 2 weeks ago  . Diarrhea    on and off for the past month     Pt feels "great"   Relevant past medical, surgical, family and social history reviewed and updated as indicated. Interim medical history since our last visit reviewed. Allergies and medications reviewed and updated.   Current Outpatient Medications:  .  aspirin 325 MG tablet, Take 650 mg by mouth 2 (two) times daily as needed., Disp: , Rfl:  .  lisinopril (PRINIVIL,ZESTRIL) 10 MG tablet, TAKE 1 TABLET BY MOUTH ONCE DAILY, Disp: 30 tablet, Rfl: 4   Review of Systems  Constitutional: Positive for chills, diaphoresis, fatigue and fever ("feverish", subjective).  HENT: Positive for trouble swallowing. Negative for dental problem.   Respiratory: Positive for cough.   Gastrointestinal: Positive for abdominal pain and diarrhea.  Endocrine: Positive for cold intolerance and heat intolerance.  Musculoskeletal: Positive for arthralgias, back pain and gait problem.  Neurological: Positive for headaches.    Per HPI unless specifically indicated above     Objective:    BP 137/76 (BP Location:  Right Arm, Patient Position: Sitting, Cuff Size: Normal)   Pulse 78   Temp 97.7 F (36.5 C) (Oral)   Ht 5\' 9"  (1.753 m)   Wt 269 lb (122 kg)   SpO2 97%   BMI 39.72 kg/m   Wt Readings from Last 3 Encounters:  06/22/18 269 lb (122 kg)  02/25/18 264 lb 8 oz (120 kg)  10/28/17 271 lb 8 oz (123.2 kg)    Physical Exam Vitals signs reviewed.  Constitutional:      Appearance: Normal appearance. He is well-developed. He is obese.  HENT:     Head: Normocephalic and atraumatic.  Neck:     Musculoskeletal: Neck supple. No neck rigidity or muscular tenderness.     Thyroid: No thyroid mass, thyromegaly or thyroid tenderness.  Cardiovascular:     Rate and Rhythm: Normal rate and regular rhythm.  Pulmonary:     Effort: Pulmonary effort is normal.     Breath sounds: Normal breath sounds. No wheezing.  Abdominal:     General: Bowel sounds are normal.     Palpations: Abdomen is soft.     Tenderness: There is no abdominal tenderness.  Musculoskeletal:     Right lower leg: No edema.     Left lower leg: No edema.  Lymphadenopathy:     Cervical: No cervical adenopathy.  Skin:    General: Skin  is warm and dry.     Findings: No rash.  Neurological:     Mental Status: He is alert and oriented to person, place, and time.  Psychiatric:        Behavior: Behavior normal.     Comments: Pt talks nonstop.  He starts talking when examiner has a conversational pause.  Pt only stops talking when asked to "hold that thought for just a minute" and only then with difficulty         Assessment & Plan:   Encounter Diagnoses  Name Primary?  . Essential hypertension Yes  . Sweating increase   . Morbid obesity (HCC)   . Screen for colon cancer     -check labs- Bmp, tsh, cbc -pt was given iFOBT for colon cancer screening -Increase lisinopril to 20mg  qd -pt to follow up 1 month to recheck bp.  RTO sooner prn

## 2018-06-29 ENCOUNTER — Ambulatory Visit: Payer: Self-pay | Admitting: Physician Assistant

## 2018-07-22 ENCOUNTER — Ambulatory Visit: Payer: Self-pay | Admitting: Physician Assistant

## 2018-07-22 ENCOUNTER — Encounter: Payer: Self-pay | Admitting: Physician Assistant

## 2018-07-22 VITALS — BP 129/81 | HR 71 | Temp 97.3°F | Ht 69.0 in | Wt 273.5 lb

## 2018-07-22 DIAGNOSIS — R195 Other fecal abnormalities: Secondary | ICD-10-CM

## 2018-07-22 DIAGNOSIS — K5900 Constipation, unspecified: Secondary | ICD-10-CM

## 2018-07-22 DIAGNOSIS — I10 Essential (primary) hypertension: Secondary | ICD-10-CM

## 2018-07-22 DIAGNOSIS — R7303 Prediabetes: Secondary | ICD-10-CM

## 2018-07-22 LAB — IFOBT (OCCULT BLOOD): IFOBT: POSITIVE

## 2018-07-22 NOTE — Progress Notes (Signed)
BP 129/81 (BP Location: Right Arm, Patient Position: Sitting, Cuff Size: Normal)   Pulse 71   Temp (!) 97.3 F (36.3 C) (Oral)   Ht 5\' 9"  (1.753 m)   Wt 273 lb 8 oz (124.1 kg)   SpO2 97%   BMI 40.39 kg/m    Subjective:    Patient ID: Aaron Lewis, male    DOB: May 17, 1966, 53 y.o.   MRN: [id number]  HPI: Aaron Lewis is a 53 y.o. male presenting on 07/22/2018 for Hypertension   HPI   Pt is doing well.  He does have some constipation.   Relevant past medical, surgical, family and social history reviewed and updated as indicated. Interim medical history since our last visit reviewed. Allergies and medications reviewed and updated.   Current Outpatient Medications:  .  aspirin 325 MG tablet, Take 650 mg by mouth 2 (two) times daily as needed., Disp: , Rfl:  .  lisinopril (PRINIVIL,ZESTRIL) 20 MG tablet, Take 1 tablet (20 mg total) by mouth daily., Disp: 30 tablet, Rfl: 4   Review of Systems  Constitutional: Positive for diaphoresis. Negative for appetite change, chills, fatigue, fever and unexpected weight change.  HENT: Negative for congestion, dental problem, drooling, ear pain, facial swelling, hearing loss, mouth sores, sneezing, sore throat, trouble swallowing and voice change.   Eyes: Negative for pain, discharge, redness, itching and visual disturbance.  Respiratory: Positive for cough. Negative for choking, shortness of breath and wheezing.   Cardiovascular: Negative for chest pain, palpitations and leg swelling.  Gastrointestinal: Positive for abdominal pain, constipation and diarrhea. Negative for blood in stool and vomiting.  Endocrine: Positive for heat intolerance. Negative for cold intolerance and polydipsia.  Genitourinary: Negative for decreased urine volume, dysuria and hematuria.  Musculoskeletal: Positive for back pain and gait problem. Negative for arthralgias.  Skin: Negative for rash.  Allergic/Immunologic: Negative for environmental allergies.   Neurological: Negative for seizures, syncope, light-headedness and headaches.  Hematological: Negative for adenopathy.  Psychiatric/Behavioral: Negative for agitation, dysphoric mood and suicidal ideas. The patient is not nervous/anxious.     Per HPI unless specifically indicated above     Objective:    BP 129/81 (BP Location: Right Arm, Patient Position: Sitting, Cuff Size: Normal)   Pulse 71   Temp (!) 97.3 F (36.3 C) (Oral)   Ht 5\' 9"  (1.753 m)   Wt 273 lb 8 oz (124.1 kg)   SpO2 97%   BMI 40.39 kg/m   Wt Readings from Last 3 Encounters:  07/22/18 273 lb 8 oz (124.1 kg)  06/22/18 269 lb (122 kg)  02/25/18 264 lb 8 oz (120 kg)    Physical Exam Vitals signs reviewed.  Constitutional:      Appearance: He is well-developed.  HENT:     Head: Normocephalic and atraumatic.  Neck:     Musculoskeletal: Neck supple.  Cardiovascular:     Rate and Rhythm: Normal rate and regular rhythm.  Pulmonary:     Effort: Pulmonary effort is normal.     Breath sounds: Normal breath sounds. No wheezing.  Abdominal:     General: Bowel sounds are normal.     Palpations: Abdomen is soft.     Tenderness: There is no abdominal tenderness.  Lymphadenopathy:     Cervical: No cervical adenopathy.  Skin:    General: Skin is warm and dry.  Neurological:     Mental Status: He is alert and oriented to person, place, and time.  Psychiatric:  Behavior: Behavior normal.     Results for orders placed or performed during the hospital encounter of 06/22/18  CBC w/Diff/Platelet  Result Value Ref Range   WBC 10.1 4.0 - 10.5 K/uL   RBC 5.29 4.22 - 5.81 MIL/uL   Hemoglobin 14.9 13.0 - 17.0 g/dL   HCT 75.6 43.3 - 29.5 %   MCV 84.5 80.0 - 100.0 fL   MCH 28.2 26.0 - 34.0 pg   MCHC 33.3 30.0 - 36.0 g/dL   RDW 18.8 41.6 - 60.6 %   Platelets 258 150 - 400 K/uL   nRBC 0.0 0.0 - 0.2 %   Neutrophils Relative % 58 %   Neutro Abs 5.9 1.7 - 7.7 K/uL   Lymphocytes Relative 28 %   Lymphs Abs 2.8  0.7 - 4.0 K/uL   Monocytes Relative 11 %   Monocytes Absolute 1.1 (H) 0.1 - 1.0 K/uL   Eosinophils Relative 2 %   Eosinophils Absolute 0.2 0.0 - 0.5 K/uL   Basophils Relative 1 %   Basophils Absolute 0.1 0.0 - 0.1 K/uL   Immature Granulocytes 0 %   Abs Immature Granulocytes 0.03 0.00 - 0.07 K/uL  TSH  Result Value Ref Range   TSH 0.905 0.350 - 4.500 uIU/mL  Basic metabolic panel  Result Value Ref Range   Sodium 137 135 - 145 mmol/L   Potassium 3.7 3.5 - 5.1 mmol/L   Chloride 104 98 - 111 mmol/L   CO2 25 22 - 32 mmol/L   Glucose, Bld 123 (H) 70 - 99 mg/dL   BUN 11 6 - 20 mg/dL   Creatinine, Ser 3.01 0.61 - 1.24 mg/dL   Calcium 9.1 8.9 - 60.1 mg/dL   GFR calc non Af Amer >60 >60 mL/min   GFR calc Af Amer >60 >60 mL/min   Anion gap 8 5 - 15      Assessment & Plan:   Encounter Diagnoses  Name Primary?  . Essential hypertension Yes  . Morbid obesity (HCC)   . Positive fecal occult blood test   . Prediabetes   . Constipation, unspecified constipation type     -Reviewed labs with pt -Refer to GI for + iFOBT -pt was given application [facility] -pt to continue current medications -pt to follow up 3 months.  RTO sooner prn

## 2018-07-22 NOTE — Patient Instructions (Signed)

## 2018-08-02 ENCOUNTER — Encounter: Payer: Self-pay | Admitting: Internal Medicine

## 2018-10-04 ENCOUNTER — Ambulatory Visit: Payer: Self-pay | Admitting: Gastroenterology

## 2018-10-20 ENCOUNTER — Ambulatory Visit: Payer: Self-pay | Admitting: Physician Assistant

## 2018-11-23 ENCOUNTER — Telehealth: Payer: Self-pay | Admitting: Physician Assistant

## 2018-12-14 ENCOUNTER — Ambulatory Visit: Payer: Self-pay | Admitting: Gastroenterology

## 2019-01-22 ENCOUNTER — Other Ambulatory Visit: Payer: Self-pay | Admitting: Physician Assistant

## 2019-03-05 ENCOUNTER — Other Ambulatory Visit: Payer: Self-pay | Admitting: Physician Assistant

## 2022-09-26 ENCOUNTER — Encounter (INDEPENDENT_AMBULATORY_CARE_PROVIDER_SITE_OTHER): Payer: Self-pay | Admitting: *Deleted

## 2022-11-25 ENCOUNTER — Ambulatory Visit (INDEPENDENT_AMBULATORY_CARE_PROVIDER_SITE_OTHER): Payer: Self-pay | Admitting: Gastroenterology

## 2022-11-25 ENCOUNTER — Encounter (INDEPENDENT_AMBULATORY_CARE_PROVIDER_SITE_OTHER): Payer: Self-pay

## 2022-11-28 ENCOUNTER — Encounter (INDEPENDENT_AMBULATORY_CARE_PROVIDER_SITE_OTHER): Payer: Self-pay | Admitting: *Deleted

## 2023-08-21 ENCOUNTER — Ambulatory Visit
Admission: EM | Admit: 2023-08-21 | Discharge: 2023-08-21 | Disposition: A | Discharge status: Home / Self Care | Attending: Family Medicine | Admitting: Family Medicine

## 2023-08-21 DIAGNOSIS — J0100 Acute maxillary sinusitis, unspecified: Secondary | ICD-10-CM

## 2023-08-21 DIAGNOSIS — I10 Essential (primary) hypertension: Secondary | ICD-10-CM

## 2023-08-21 HISTORY — DX: Essential (primary) hypertension: I10

## 2023-08-21 MED ORDER — DOXYCYCLINE HYCLATE 100 MG PO CAPS: 100.0000 mg | ORAL_CAPSULE | Freq: Two times a day (BID) | ORAL | 0 refills | Status: AC

## 2023-08-21 NOTE — ED Provider Notes (Signed)
 [facility]   [id number] 08/21/23 Arrival Time: 1205  ASSESSMENT & PLAN:  1. Acute non-recurrent maxillary sinusitis   2. Elevated blood pressure reading with diagnosis of hypertension    Begin: Meds ordered this encounter  Medications   doxycycline (VIBRAMYCIN) 100 MG capsule    Sig: Take 1 capsule (100 mg total) by mouth 2 (two) times daily.    Dispense:  14 capsule    Refill:  0   Discussed typical duration of symptoms. OTC symptom care as needed. Ensure adequate fluid intake and rest.    Discharge Instructions      Your blood pressure was noted to be elevated during your visit today. If you are currently taking medication for high blood pressure, please ensure you are taking this as directed. If you do not have a history of high blood pressure and your blood pressure remains persistently elevated, you may need to begin taking a medication at some point. You may return here within the next few days to recheck if unable to see your primary care provider or if you do not have a one.  BP (!) 161/100 (BP Location: Right Arm)   Pulse 88   Temp 98.3 F (36.8 C) (Oral)   Resp 20   SpO2 97%   BP Readings from Last 3 Encounters:  08/21/23 (!) 161/100  07/22/18 129/81  06/22/18 137/76          Follow-up Information     Fanta, Wayland Salinas, MD.   Specialty: Internal Medicine Why: To recheck your blood pressure. Contact information: [address] [place] [address] [phone]                 Reviewed expectations re: course of current medical issues. Questions answered. Outlined signs and symptoms indicating need for more acute intervention. Patient verbalized understanding. After Visit Summary given.   SUBJECTIVE: History from: patient.  Aaron Lewis is a 58 y.o. male who presents with complaint of nasal congestion, post-nasal drainage, and sinus pain. Onset gradual, a week ago. Respiratory symptoms: none. Fever:  absent. Overall normal PO intake without n/v. OTC treatment: none. Seasonal allergies: yes. History of frequent sinus infections: yes. No specific aggravating or alleviating factors reported. Social History   Tobacco Use  Smoking Status Former   Current packs/day: 0.00   Average packs/day: 2.0 packs/day for 33.0 years (66.0 ttl pk-yrs)   Types: Cigarettes   Start date: 12/1974   Quit date: 12/2007   Years since quitting: 15.7  Smokeless Tobacco Former   Types: Chew   Quit date: 1986  Tobacco Comments   smokes marijuana   Increased blood pressure noted today. Reports that he is treated for HTN. Taking meds as directed.   OBJECTIVE:  Vitals:   08/21/23 1232  BP: (!) 161/100  Pulse: 88  Resp: 20  Temp: 98.3 F (36.8 C)  TempSrc: Oral  SpO2: 97%     General appearance: alert; no distress HEENT: nasal congestion; clear runny nose; throat irritation secondary to post-nasal drainage; bilateral maxillary tenderness to palpation; turbinates boggy Neck: supple without LAD; trachea midline Lungs: unlabored respirations, symmetrical air entry; cough: absent; no respiratory distress Skin: warm and dry Psychological: alert and cooperative; normal mood and affect  Allergies  Allergen Reactions   Penicillins Rash    Past Medical History:  Diagnosis Date   Hypertension    Family History  Problem Relation Age of Onset   Stroke Father 48       bleeding  Social History   Socioeconomic History   Marital status: Single    Spouse name: Not on file   Number of children: Not on file   Years of education: Not on file   Highest education level: Not on file  Occupational History   Not on file  Tobacco Use   Smoking status: Former    Current packs/day: 0.00    Average packs/day: 2.0 packs/day for 33.0 years (66.0 ttl pk-yrs)    Types: Cigarettes    Start date: 12/1974    Quit date: 12/2007    Years since quitting: 15.7   Smokeless tobacco: Former    Types: Chew    Quit  date: 1986   Tobacco comments:    smokes marijuana  Vaping Use   Vaping status: Never Used  Substance and Sexual Activity   Alcohol use: No    Comment: former alcoholic, quit 2011   Drug use: Yes    Types: Marijuana, Cocaine, Morphine, Benzodiazepines, LSD    Comment: occassional. hx daily.  nothing but MJ since age 45   Sexual activity: Not on file  Other Topics Concern   Not on file  Social History Narrative   Not on file   Social Drivers of Health   Financial Resource Strain: High Risk (09/18/2021)   Received from [facility]   Overall Financial Resource Strain (CARDIA)    Difficulty of Paying Living Expenses: Very hard  Food Insecurity: No Food Insecurity (03/20/2022)   Received from [facility]   Hunger Vital Sign    Worried About Running Out of Food in the Last Year: Never true    Ran Out of Food in the Last Year: Never true  Transportation Needs: No Transportation Needs (09/18/2021)   Received from [facility]   [facility] - Transportation    Lack of Transportation (Medical): No    Lack of Transportation (Non-Medical): No  Physical Activity: Not on file  Stress: Not on file  Social Connections: Not on file  Intimate Partner Violence: Not on file             Mardella Layman, MD 08/21/23 [phone]

## 2023-08-21 NOTE — ED Triage Notes (Signed)
 Pt reports he has nasal pressure, head pressure x 5 days   Pt reports he has bottom lip swelling after using liquid stevia sweetner since this morning

## 2023-08-21 NOTE — Discharge Instructions (Signed)
 Your blood pressure was noted to be elevated during your visit today. If you are currently taking medication for high blood pressure, please ensure you are taking this as directed. If you do not have a history of high blood pressure and your blood pressure remains persistently elevated, you may need to begin taking a medication at some point. You may return here within the next few days to recheck if unable to see your primary care provider or if you do not have a one.  BP (!) 161/100 (BP Location: Right Arm)   Pulse 88   Temp 98.3 F (36.8 C) (Oral)   Resp 20   SpO2 97%   BP Readings from Last 3 Encounters:  08/21/23 (!) 161/100  07/22/18 129/81  06/22/18 137/76

## 2023-12-18 ENCOUNTER — Other Ambulatory Visit (HOSPITAL_COMMUNITY)
Admission: RE | Admit: 2023-12-18 | Discharge: 2023-12-18 | Disposition: A | Discharge status: Home / Self Care | Source: Ambulatory Visit | Attending: Internal Medicine | Admitting: Internal Medicine

## 2023-12-18 DIAGNOSIS — E119 Type 2 diabetes mellitus without complications: Secondary | ICD-10-CM | POA: Diagnosis present

## 2023-12-18 DIAGNOSIS — I10 Essential (primary) hypertension: Secondary | ICD-10-CM | POA: Diagnosis present

## 2023-12-18 LAB — BASIC METABOLIC PANEL WITH GFR
Anion gap: 11 (ref 5–15)
BUN: 11 mg/dL (ref 6–20)
CO2: 25 mmol/L (ref 22–32)
Calcium: 9.3 mg/dL (ref 8.9–10.3)
Chloride: 101 mmol/L (ref 98–111)
Creatinine, Ser: 0.75 mg/dL (ref 0.61–1.24)
GFR, Estimated: >60 mL/min (ref >60)
Glucose, Bld: 129 mg/dL — ABNORMAL HIGH (ref 70–99)
Potassium: 4.1 mmol/L (ref 3.5–5.1)
Sodium: 137 mmol/L (ref 135–145)

## 2023-12-18 LAB — HEMOGLOBIN A1C
Hgb A1c MFr Bld: 5.4 % (ref 4.8–5.6)
Mean Plasma Glucose: 108.28 mg/dL

## 2024-03-24 ENCOUNTER — Other Ambulatory Visit (HOSPITAL_COMMUNITY)
Admission: RE | Admit: 2024-03-24 | Discharge: 2024-03-24 | Disposition: A | Discharge status: Home / Self Care | Source: Ambulatory Visit | Attending: Internal Medicine | Admitting: Internal Medicine

## 2024-03-24 DIAGNOSIS — E119 Type 2 diabetes mellitus without complications: Secondary | ICD-10-CM | POA: Diagnosis present

## 2024-03-24 DIAGNOSIS — E7849 Other hyperlipidemia: Secondary | ICD-10-CM | POA: Diagnosis not present

## 2024-03-24 LAB — HEMOGLOBIN A1C
Hgb A1c MFr Bld: 5.4 % (ref 4.8–5.6)
Mean Plasma Glucose: 108.28 mg/dL
